# Patient Record
Sex: Female | Born: 1995
Health system: Southern US, Community
[De-identification: ages and names within clinical notes are randomized; demographics above are authoritative.]

## PROBLEM LIST (undated history)

## (undated) DIAGNOSIS — L309 Dermatitis, unspecified: Secondary | ICD-10-CM

## (undated) DIAGNOSIS — M21611 Bunion of right foot: Secondary | ICD-10-CM

## (undated) DIAGNOSIS — M21612 Bunion of left foot: Secondary | ICD-10-CM

## (undated) HISTORY — PX: WISDOM TOOTH EXTRACTION: SHX21

## (undated) HISTORY — PX: FOOT SURGERY: SHX648

---
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
## 2016-06-13 ENCOUNTER — Encounter (HOSPITAL_COMMUNITY): Payer: Self-pay | Admitting: Emergency Medicine

## 2016-06-13 ENCOUNTER — Emergency Department (HOSPITAL_COMMUNITY)
Admission: EM | Admit: 2016-06-13 | Discharge: 2016-06-13 | Disposition: A | Discharge status: Home / Self Care | Payer: Self-pay | Attending: Dermatology | Admitting: Dermatology

## 2016-06-13 DIAGNOSIS — K920 Hematemesis: Secondary | ICD-10-CM | POA: Insufficient documentation

## 2016-06-13 DIAGNOSIS — Z5321 Procedure and treatment not carried out due to patient leaving prior to being seen by health care provider: Secondary | ICD-10-CM | POA: Insufficient documentation

## 2016-06-13 DIAGNOSIS — R51 Headache: Secondary | ICD-10-CM | POA: Insufficient documentation

## 2016-06-13 LAB — COMPREHENSIVE METABOLIC PANEL
ALK PHOS: 85 U/L (ref 38–126)
ALT: 15 U/L (ref 14–54)
AST: 24 U/L (ref 15–41)
Albumin: 5 g/dL (ref 3.5–5.0)
Anion gap: 9 (ref 5–15)
BILIRUBIN TOTAL: 0.6 mg/dL (ref 0.3–1.2)
BUN: 12 mg/dL (ref 6–20)
CALCIUM: 10.1 mg/dL (ref 8.9–10.3)
CO2: 22 mmol/L (ref 22–32)
CREATININE: 0.96 mg/dL (ref 0.44–1.00)
Chloride: 108 mmol/L (ref 101–111)
Glucose, Bld: 95 mg/dL (ref 65–99)
Potassium: 4.1 mmol/L (ref 3.5–5.1)
SODIUM: 139 mmol/L (ref 135–145)
Total Protein: 7.7 g/dL (ref 6.5–8.1)

## 2016-06-13 LAB — CBC WITH DIFFERENTIAL/PLATELET
BASOS PCT: 0 %
Basophils Absolute: 0 10*3/uL (ref 0.0–0.1)
EOS ABS: 0 10*3/uL (ref 0.0–0.7)
Eosinophils Relative: 0 %
HCT: 42.9 % (ref 36.0–46.0)
HEMOGLOBIN: 14.1 g/dL (ref 12.0–15.0)
Lymphocytes Relative: 26 %
Lymphs Abs: 2 10*3/uL (ref 0.7–4.0)
MCH: 30.1 pg (ref 26.0–34.0)
MCHC: 32.9 g/dL (ref 30.0–36.0)
MCV: 91.7 fL (ref 78.0–100.0)
MONO ABS: 0.5 10*3/uL (ref 0.1–1.0)
MONOS PCT: 6 %
NEUTROS PCT: 68 %
Neutro Abs: 5.1 10*3/uL (ref 1.7–7.7)
Platelets: 269 10*3/uL (ref 150–400)
RBC: 4.68 MIL/uL (ref 3.87–5.11)
RDW: 12.3 % (ref 11.5–15.5)
WBC: 7.7 10*3/uL (ref 4.0–10.5)

## 2016-06-13 LAB — LIPASE, BLOOD: Lipase: 15 U/L (ref 11–51)

## 2016-06-13 LAB — I-STAT BETA HCG BLOOD, ED (MC, WL, AP ONLY)

## 2016-06-13 MED ORDER — OXYCODONE-ACETAMINOPHEN 5-325 MG PO TABS
1.0000 | ORAL_TABLET | ORAL | Status: DC | PRN
  Administered 2016-06-13: 1 via ORAL

## 2016-06-13 MED ORDER — OXYCODONE-ACETAMINOPHEN 5-325 MG PO TABS
ORAL_TABLET | ORAL | Status: AC
  Filled 2016-06-13: qty 1

## 2016-06-13 MED ORDER — ONDANSETRON 4 MG PO TBDP
ORAL_TABLET | ORAL | Status: AC
  Filled 2016-06-13: qty 1

## 2016-06-13 MED ORDER — ONDANSETRON 4 MG PO TBDP
4.0000 mg | ORAL_TABLET | Freq: Once | ORAL | Status: AC
  Administered 2016-06-13: 4 mg via ORAL

## 2016-06-13 NOTE — ED Notes (Signed)
Patient states does not want to wait anymore will come back if needed.

## 2016-06-13 NOTE — ED Triage Notes (Signed)
Pt reports new onset headache today with pressure around eyes. Pt reports emesis today X3 with blood noted in emesis. LMP- two months ago, pt receives Deppo. Pt also reports a lump to bottom of throat. No acute distress noted. Breathing freely without difficulty.

## 2016-06-14 DIAGNOSIS — R51 Headache: Secondary | ICD-10-CM | POA: Insufficient documentation

## 2016-06-14 DIAGNOSIS — K920 Hematemesis: Secondary | ICD-10-CM | POA: Diagnosis not present

## 2016-06-15 ENCOUNTER — Emergency Department (HOSPITAL_COMMUNITY)
Admission: EM | Admit: 2016-06-15 | Discharge: 2016-06-15 | Disposition: A | Discharge status: Home / Self Care | Payer: BLUE CROSS/BLUE SHIELD | Attending: Emergency Medicine | Admitting: Emergency Medicine

## 2016-06-15 ENCOUNTER — Emergency Department (HOSPITAL_COMMUNITY): Payer: BLUE CROSS/BLUE SHIELD

## 2016-06-15 ENCOUNTER — Encounter (HOSPITAL_COMMUNITY): Payer: Self-pay | Admitting: Emergency Medicine

## 2016-06-15 DIAGNOSIS — K920 Hematemesis: Secondary | ICD-10-CM

## 2016-06-15 DIAGNOSIS — R519 Headache, unspecified: Secondary | ICD-10-CM

## 2016-06-15 DIAGNOSIS — R51 Headache: Secondary | ICD-10-CM

## 2016-06-15 LAB — CBC WITH DIFFERENTIAL/PLATELET
BASOS ABS: 0 10*3/uL (ref 0.0–0.1)
Basophils Relative: 1 %
EOS PCT: 2 %
Eosinophils Absolute: 0.1 10*3/uL (ref 0.0–0.7)
HCT: 41 % (ref 36.0–46.0)
Hemoglobin: 13.3 g/dL (ref 12.0–15.0)
LYMPHS PCT: 48 %
Lymphs Abs: 2.6 10*3/uL (ref 0.7–4.0)
MCH: 29.8 pg (ref 26.0–34.0)
MCHC: 32.4 g/dL (ref 30.0–36.0)
MCV: 91.7 fL (ref 78.0–100.0)
MONO ABS: 0.5 10*3/uL (ref 0.1–1.0)
MONOS PCT: 10 %
Neutro Abs: 2.1 10*3/uL (ref 1.7–7.7)
Neutrophils Relative %: 39 %
PLATELETS: 237 10*3/uL (ref 150–400)
RBC: 4.47 MIL/uL (ref 3.87–5.11)
RDW: 12.3 % (ref 11.5–15.5)
WBC: 5.3 10*3/uL (ref 4.0–10.5)

## 2016-06-15 LAB — BASIC METABOLIC PANEL
Anion gap: 6 (ref 5–15)
BUN: 11 mg/dL (ref 6–20)
CALCIUM: 9.8 mg/dL (ref 8.9–10.3)
CO2: 24 mmol/L (ref 22–32)
Chloride: 106 mmol/L (ref 101–111)
Creatinine, Ser: 0.93 mg/dL (ref 0.44–1.00)
GFR calc Af Amer: >60 mL/min (ref >60)
GLUCOSE: 101 mg/dL — AB (ref 65–99)
Potassium: 3.9 mmol/L (ref 3.5–5.1)
Sodium: 136 mmol/L (ref 135–145)

## 2016-06-15 LAB — HCG, SERUM, QUALITATIVE: Preg, Serum: NEGATIVE

## 2016-06-15 MED ORDER — ONDANSETRON HCL 4 MG/2ML IJ SOLN
4.0000 mg | Freq: Once | INTRAMUSCULAR | Status: AC
  Administered 2016-06-15: 4 mg via INTRAVENOUS
  Filled 2016-06-15: qty 2

## 2016-06-15 MED ORDER — KETOROLAC TROMETHAMINE 30 MG/ML IJ SOLN
30.0000 mg | Freq: Once | INTRAMUSCULAR | Status: AC
  Administered 2016-06-15: 30 mg via INTRAVENOUS
  Filled 2016-06-15: qty 1

## 2016-06-15 MED ORDER — KETOROLAC TROMETHAMINE 60 MG/2ML IM SOLN: 60.0000 mg | Freq: Once | INTRAMUSCULAR | Status: DC

## 2016-06-15 MED ORDER — SODIUM CHLORIDE 0.9 % IV BOLUS (SEPSIS)
1000.0000 mL | Freq: Once | INTRAVENOUS | Status: AC
  Administered 2016-06-15: 1000 mL via INTRAVENOUS

## 2016-06-15 NOTE — ED Triage Notes (Signed)
Pt. reports headache onset yesterday and skin lump at lower throat , denies head injury , no nausea or emesis.

## 2016-06-15 NOTE — ED Provider Notes (Signed)
[facility]-EMERGENCY DEPT Provider Note   CSN: [id number] Arrival date & time: 06/14/16  2359  First Provider Contact:  None       History   Chief Complaint Chief Complaint  Patient presents with  . Migraine  . Mass    HPI Lisa Lopez is a 20 y.o. female.  Patient is an 20 year old female with no significant past medical history. She presents for evaluation of headache that has been ongoing for the past 2 days. Her headache is frontal and radiates to the back of her head. She reports associated nausea and vomiting of what she describes as bloody vomit. She denies any fevers or chills. She denies any visual disturbances. She also complains of discomfort in the bottom of her throat.   The history is provided by the patient.  Migraine  This is a new problem. The current episode started 2 days ago. The problem occurs constantly. The problem has been gradually worsening. Pertinent negatives include no shortness of breath. Nothing aggravates the symptoms. Nothing relieves the symptoms. She has tried nothing for the symptoms. The treatment provided no relief.    Past Medical History:  Diagnosis Date  . Bunion of great toe of left foot   . Bunion of great toe of right foot   . Eczema     There are no active problems to display for this patient.   Past Surgical History:  Procedure Laterality Date  . FOOT SURGERY    . WISDOM TOOTH EXTRACTION      OB History    No data available       Home Medications    Prior to Admission medications   Medication Sig Start Date End Date Taking? Authorizing Provider  cephALEXin (KEFLEX) 500 MG capsule Take 1 capsule (500 mg total) by mouth 3 (three) times daily. Patient not taking: Reported on 06/15/2016 07/10/14   Marcellina Millin, MD    Family History No family history on file.  Social History Social History  Substance Use Topics  . Smoking status: Never Smoker  . Smokeless tobacco: Never Used  . Alcohol use No     Allergies     Review of patient's allergies indicates no known allergies.   Review of Systems Review of Systems  Respiratory: Negative for shortness of breath.   All other systems reviewed and are negative.    Physical Exam Updated Vital Signs BP 125/63 (BP Location: Right Arm)   Pulse 76   Temp 99.3 F (37.4 C) (Oral)   Resp 16   Ht 5\' 3"  (1.6 m)   Wt 136 lb (61.7 kg)   LMP 04/13/2016   SpO2 100%   BMI 24.09 kg/m   Physical Exam  Constitutional: She is oriented to person, place, and time. She appears well-developed and well-nourished. No distress.  HENT:  Head: Normocephalic and atraumatic.  Mouth/Throat: Oropharynx is clear and moist. No oropharyngeal exudate.  Eyes: EOM are normal. Pupils are equal, round, and reactive to light.  Neck: Normal range of motion. Neck supple.  Cardiovascular: Normal rate and regular rhythm.  Exam reveals no gallop and no friction rub.   No murmur heard. Pulmonary/Chest: Effort normal and breath sounds normal. No respiratory distress. She has no wheezes.  Abdominal: Soft. Bowel sounds are normal. She exhibits no distension. There is no tenderness.  Musculoskeletal: Normal range of motion. She exhibits no edema.  Neurological: She is alert and oriented to person, place, and time.  Skin: Skin is warm and dry. She is  not diaphoretic.  Nursing note and vitals reviewed.    ED Treatments / Results  Labs (all labs ordered are listed, but only abnormal results are displayed) Labs Reviewed  BASIC METABOLIC PANEL  CBC WITH DIFFERENTIAL/PLATELET  HCG, SERUM, QUALITATIVE    EKG  EKG Interpretation None       Radiology No results found.  Procedures Procedures (including critical care time)  Medications Ordered in ED Medications  sodium chloride 0.9 % bolus 1,000 mL (not administered)  ketorolac (TORADOL) 30 MG/ML injection 30 mg (not administered)  ondansetron (ZOFRAN) injection 4 mg (not administered)     Initial Impression / Assessment  and Plan / ED Course  I have reviewed the triage vital signs and the nursing notes.  Pertinent labs & imaging results that were available during my care of the patient were reviewed by me and considered in my medical decision making (see chart for details).  Clinical Course      Final Clinical Impressions(s) / ED Diagnoses   Final diagnoses:  None   Patient presents here with multiple complaints that do not appear to be related. She is reporting headache, discomfort in her throat, and vomiting blood. Her laboratory studies are unremarkable, neurologic and abdominal exams are nonfocal, and she appears clinically well. Head CT reveals no evidence for intracranial pathology. At this point, I'm having a difficult time attributing all of these symptoms to 1 disease process. I see no indication for further testing. We will give this time and see how things unfold. If she worsens, she is to return to be reevaluated.  New Prescriptions New Prescriptions   No medications on file     Geoffery Lyons, MD 06/15/16 [phone]

## 2016-06-15 NOTE — Discharge Instructions (Signed)
Ibuprofen 600 mg every 6 hours as needed for pain. ° °Return to the emergency department if your symptoms significantly worsen or change. °

## 2016-07-26 ENCOUNTER — Encounter: Payer: Self-pay | Admitting: Neurology

## 2016-07-26 ENCOUNTER — Ambulatory Visit (INDEPENDENT_AMBULATORY_CARE_PROVIDER_SITE_OTHER): Payer: BLUE CROSS/BLUE SHIELD | Admitting: Neurology

## 2016-07-26 VITALS — BP 112/68 | HR 86 | Ht 63.0 in | Wt 136.6 lb

## 2016-07-26 DIAGNOSIS — G43709 Chronic migraine without aura, not intractable, without status migrainosus: Secondary | ICD-10-CM

## 2016-07-26 MED ORDER — ONDANSETRON HCL 4 MG PO TABS: 4.0000 mg | ORAL_TABLET | Freq: Three times a day (TID) | ORAL | 0 refills | Status: AC | PRN

## 2016-07-26 MED ORDER — SUMATRIPTAN SUCCINATE 100 MG PO TABS: ORAL_TABLET | ORAL | 2 refills | Status: AC

## 2016-07-26 MED ORDER — PROPRANOLOL HCL ER 60 MG PO CP24: 60.0000 mg | ORAL_CAPSULE | Freq: Every day | ORAL | 0 refills | Status: DC

## 2016-07-26 NOTE — Progress Notes (Signed)
NEUROLOGY CONSULTATION NOTE  Lisa Lopez MRN: [id number] DOB: [birth year]  Referring provider: Dr. Chales Salmon Primary care provider: Dr. Chales Salmon  Reason for consult:  headache  HISTORY OF PRESENT ILLNESS: Lisa Lopez is an 20 year old right-handed female with depression and anxiety who presents for headache.  History obtained by patient, PCP note and ED note.  Onset:  Age 52 Location:  Bi-frontal/temporal.  Notes some bilateral myofascial neck pain. Quality:  throbbing Intensity:  From 5-6 to 8-9/10 Aura:  no Prodrome:  no Associated symptoms:  Nausea, vomiting, phonophobia.  No photophobia or visual disturbance Duration:  1 to 5 hours Frequency:  Daily (severe 2 to 3 days per week) Triggers/exacerbating factors:  stress Relieving factors:  no Activity:  Cannot function when severe  CT of head from 06/15/16 was personally reviewed and was normal Labs from 06/15/16 include CBC with WBC 5.3, HGB 13.3, HCT 41, and PLT 237.  BMP showed Na 136, K 3.9, Cl 106, CO2 24, glucose 101, BUN 11 and Cr 0.93.  Past NSAIDS:  ibuprofen Past analgesics:  Tylenol, Goodys Past abortive triptans:  no Past muscle relaxants:  no Past anti-emetic:  no Past antihypertensive medications:  no Past antidepressant medications:  bupropion Past anticonvulsant medications:  no Past vitamins/Herbal/Supplements:  no Past antihistamines/decongestants:  no  Current NSAIDS:  Aleve Current analgesics:  no Current triptans:  no Current anti-emetic:  no Current muscle relaxants:  no Current anti-anxiolytic:  no Current sleep aide:  no Current Antihypertensive medications:  no Current Antidepressant medications:  Paxil Current Anticonvulsant medications:  no Current Vitamins/Herbal/Supplements:  no Current Antihistamines/Decongestants:  no Other therapy:  no Other medication:  Depo  Caffeine:  Sweet tea Alcohol:  no Smoker:  no Diet:  Does not hydrate enough.  No fast food. Exercise:  Not  routine Depression/stress:  controlled Sleep hygiene:  good Family history of headache:  No.  No family history of cerebral aneurysm  PAST MEDICAL HISTORY: Past Medical History:  Diagnosis Date  . Bunion of great toe of left foot   . Bunion of great toe of right foot   . Eczema     PAST SURGICAL HISTORY: Past Surgical History:  Procedure Laterality Date  . FOOT SURGERY    . WISDOM TOOTH EXTRACTION      MEDICATIONS: Paxil  ALLERGIES: No Known Allergies  FAMILY HISTORY: No family history of headache  SOCIAL HISTORY: Social History   Social History  . Marital status: Single    Spouse name: N/A  . Number of children: N/A  . Years of education: N/A   Occupational History  . Not on file.   Social History Main Topics  . Smoking status: Never Smoker  . Smokeless tobacco: Never Used  . Alcohol use No  . Drug use: No  . Sexual activity: Not on file   Other Topics Concern  . Not on file   Social History Narrative  . No narrative on file    REVIEW OF SYSTEMS: Constitutional: No fevers, chills, or sweats, no generalized fatigue, change in appetite Eyes: No visual changes, double vision, eye pain Ear, nose and throat: No hearing loss, ear pain, nasal congestion, sore throat Cardiovascular: No chest pain, palpitations Respiratory:  No shortness of breath at rest or with exertion, wheezes GastrointestinaI: No nausea, vomiting, diarrhea, abdominal pain, fecal incontinence Genitourinary:  No dysuria, urinary retention or frequency Musculoskeletal:  No neck pain, back pain Integumentary: No rash, pruritus, skin lesions Neurological: as above Psychiatric: No depression, insomnia,  anxiety Endocrine: No palpitations, fatigue, diaphoresis, mood swings, change in appetite, change in weight, increased thirst Hematologic/Lymphatic:  No purpura, petechiae. Allergic/Immunologic: no itchy/runny eyes, nasal congestion, recent allergic reactions, rashes  PHYSICAL  EXAM: Vitals:   07/26/16 0958  BP: 112/68  Pulse: 86   General: No acute distress.  Patient appears well-groomed.  Head:  Normocephalic/atraumatic Eyes:  fundi examined but not visualized Neck: supple, no paraspinal tenderness, full range of motion Back: No paraspinal tenderness Heart: regular rate and rhythm Lungs: Clear to auscultation bilaterally. Vascular: No carotid bruits. Neurological Exam: Mental status: alert and oriented to person, place, and time, recent and remote memory intact, fund of knowledge intact, attention and concentration intact, speech fluent and not dysarthric, language intact. Cranial nerves: CN I: not tested CN II: pupils equal, round and reactive to light, visual fields intact CN III, IV, VI:  full range of motion, no nystagmus, no ptosis CN V: facial sensation intact CN VII: upper and lower face symmetric CN VIII: hearing intact CN IX, X: gag intact, uvula midline CN XI: sternocleidomastoid and trapezius muscles intact CN XII: tongue midline Bulk & Tone: normal, no fasciculations. Motor:  5/5 throughout  Sensation: temperature and vibration sensation intact. Deep Tendon Reflexes:  2+ throughout, toes downgoing.  Finger to nose testing:  Without dysmetria.  Heel to shin:  Without dysmetria.  Gait:  Normal station and stride.  Able to turn and tandem walk. Romberg negative.  IMPRESSION: Chronic migraine without aura  PLAN: 1.  Start propranolol ER 60mg  daily.  Cautioned for lightheadedness, low blood pressure and heart rate. 2.  Sumatriptan 100mg  for abortive therapy.  Zofran 4mg  for nausea 3.  Lifestyle modification:  Hydration, exercise, consider supplements (Mg, riboflavin, coenzyme Q10) 4.  Headache diary 5.  Contact us in 4 weeks with update.  Follow up in 3 months.  Thank you for allowing me to take part in the care of this patient.  Shon MilletAdam Nely Dedmon, DO  CC:  Lorenda Ishiharaupashree Varadarajan, MD

## 2016-07-26 NOTE — Progress Notes (Signed)
Chart forwarded.  

## 2016-07-26 NOTE — Patient Instructions (Signed)
Migraine Recommendations: 1.  Start propranolol ER 60mg  daily.  Call in 4 weeks with update and we can adjust dose if needed.  Caution for lightheadedness. 2.  Take sumatriptan 100mg  at earliest onset of headache.  May repeat dose once in 2 hours if needed.  Do not exceed two tablets in 24 hours.  May take Zofran as directed for nausea. 3.  Limit use of pain relievers to no more than 2 days out of the week.  These medications include acetaminophen, ibuprofen, triptans and narcotics.  This will help reduce risk of rebound headaches. 4.  Be aware of common food triggers such as processed sweets, processed foods with nitrites (such as deli meat, hot dogs, sausages), foods with MSG, alcohol (such as wine), chocolate, certain cheeses, certain fruits (dried fruits, some citrus fruit), vinegar, diet soda. 4.  Avoid caffeine 5.  Routine exercise 6.  Proper sleep hygiene 7.  Stay adequately hydrated with water 8.  Keep a headache diary. 9.  Maintain proper stress management. 10.  Do not skip meals. 11.  Consider supplements:  Magnesium oxide 400mg  to 600mg  daily, riboflavin 400mg , Coenzyme Q 10 100mg  three times daily 12.  Follow up in 3 months but contact us in 4 weeks with update.

## 2016-10-26 ENCOUNTER — Ambulatory Visit: Payer: BLUE CROSS/BLUE SHIELD | Admitting: Neurology

## 2016-10-27 ENCOUNTER — Encounter: Payer: Self-pay | Admitting: Neurology

## 2016-12-12 DIAGNOSIS — Z3042 Encounter for surveillance of injectable contraceptive: Secondary | ICD-10-CM | POA: Diagnosis not present

## 2017-03-06 DIAGNOSIS — Z3042 Encounter for surveillance of injectable contraceptive: Secondary | ICD-10-CM | POA: Diagnosis not present

## 2017-04-17 ENCOUNTER — Encounter (HOSPITAL_COMMUNITY): Payer: Self-pay | Admitting: Emergency Medicine

## 2017-04-17 ENCOUNTER — Emergency Department (HOSPITAL_COMMUNITY)
Admission: EM | Admit: 2017-04-17 | Discharge: 2017-04-17 | Disposition: A | Discharge status: Home / Self Care | Payer: BLUE CROSS/BLUE SHIELD | Attending: Emergency Medicine | Admitting: Emergency Medicine

## 2017-04-17 ENCOUNTER — Emergency Department (HOSPITAL_COMMUNITY): Payer: BLUE CROSS/BLUE SHIELD

## 2017-04-17 DIAGNOSIS — R072 Precordial pain: Secondary | ICD-10-CM | POA: Diagnosis not present

## 2017-04-17 DIAGNOSIS — R079 Chest pain, unspecified: Secondary | ICD-10-CM | POA: Diagnosis not present

## 2017-04-17 LAB — CBC WITH DIFFERENTIAL/PLATELET
Basophils Absolute: 0 10*3/uL (ref 0.0–0.1)
Basophils Relative: 0 %
Eosinophils Absolute: 0.1 10*3/uL (ref 0.0–0.7)
Eosinophils Relative: 2 %
HCT: 41.4 % (ref 36.0–46.0)
Hemoglobin: 13.5 g/dL (ref 12.0–15.0)
Lymphocytes Relative: 30 %
Lymphs Abs: 1.6 10*3/uL (ref 0.7–4.0)
MCH: 29.7 pg (ref 26.0–34.0)
MCHC: 32.6 g/dL (ref 30.0–36.0)
MCV: 91 fL (ref 78.0–100.0)
Monocytes Absolute: 0.5 10*3/uL (ref 0.1–1.0)
Monocytes Relative: 10 %
Neutro Abs: 3 10*3/uL (ref 1.7–7.7)
Neutrophils Relative %: 58 %
Platelets: 254 10*3/uL (ref 150–400)
RBC: 4.55 MIL/uL (ref 3.87–5.11)
RDW: 12.4 % (ref 11.5–15.5)
WBC: 5.2 10*3/uL (ref 4.0–10.5)

## 2017-04-17 LAB — BASIC METABOLIC PANEL
Anion gap: 7 (ref 5–15)
BUN: 13 mg/dL (ref 6–20)
CO2: 26 mmol/L (ref 22–32)
Calcium: 9.7 mg/dL (ref 8.9–10.3)
Chloride: 107 mmol/L (ref 101–111)
Creatinine, Ser: 1.17 mg/dL — ABNORMAL HIGH (ref 0.44–1.00)
GFR calc Af Amer: >60 mL/min (ref >60)
GFR calc non Af Amer: >60 mL/min (ref >60)
Glucose, Bld: 107 mg/dL — ABNORMAL HIGH (ref 65–99)
Potassium: 3.9 mmol/L (ref 3.5–5.1)
Sodium: 140 mmol/L (ref 135–145)

## 2017-04-17 LAB — TROPONIN I: Troponin I: <0.03 ng/mL (ref <0.03)

## 2017-04-17 NOTE — ED Notes (Signed)
Pt stable, ambulatory, states understanding of discharge instructions 

## 2017-04-17 NOTE — ED Provider Notes (Signed)
[facility]EMER[facility]ENCY DEPT Provider Note   CSN: [id number] Arrival date & time: 04/17/17  1359  By signing my name below, I, Phillips ClimesFabiola de Louis, attest that this documentation has been prepared under the direction and in the presence of Raeford RazorKohut, Shatyra Becka, MD . Electronically Signed: Phillips ClimesFabiola de Louis, Scribe. 04/17/2017. 5:24 PM.  History   Chief Complaint Chief Complaint  Patient presents with  . Chest Pain   HPI Comments Lisa Lopez is a 21 y.o. female with no reported PMHx, who presents to the Emergency Department with complaints of a sharp, waxing and waning chest pain x1 week.  Pt describes this as a shooting and throbbing substernal pain that is worse with coughing and currently rated a 10/10 in severity. Non-exertional; not worse with walking or exercise.  No change with eating.   Associated with dyspnea, numbness and nausea. Episodes have become progressively worse, this one lasting x2 hours.  No leg pain or swelling.  No hx of PE/DVT, recent long travel, surgery, fracture, prolonged immobilization.  Pt is on depo for birth control.  She has attempted to treat her sx with alka-seltzer and Excedrin, and experienced no relief.  Pt denies experiencing any other acute sx, including vomiting, fevers or chills.  The history is provided by the patient and medical records. No language interpreter was used.   Past Medical History:  Diagnosis Date  . Bunion of great toe of left foot   . Bunion of great toe of right foot   . Eczema     Patient Active Problem List   Diagnosis Date Noted  . Chronic migraine without aura without status migrainosus, not intractable 07/26/2016    Past Surgical History:  Procedure Laterality Date  . FOOT SURGERY    . WISDOM TOOTH EXTRACTION      OB History    No data available       Home Medications    Prior to Admission medications   Medication Sig Start Date End Date Taking? Authorizing Provider  medroxyPROGESTERone (DEPO-PROVERA) 150 MG/ML injection  INJECT 1 ML BY INTRAMUSCULAR ROUTE ONCE EVERY 13 WEEKS 04/27/16   [provider]  ondansetron (ZOFRAN) 4 MG tablet Take 1 tablet (4 mg total) by mouth every 8 (eight) hours as needed for nausea or vomiting. 07/26/16   Everlena CooperJaffe, Adam R, DO  PARoxetine (PAXIL) 10 MG tablet Take 10 mg by mouth every morning. 07/08/16   [provider]  propranolol ER (INDERAL LA) 60 MG 24 hr capsule Take 1 capsule (60 mg total) by mouth daily. 07/26/16   Drema DallasJaffe, Adam R, DO  SUMAtriptan (IMITREX) 100 MG tablet Take 1 tab at earliest onset of headache.  May repeat once in 2 hours if headache persists or recurs. Do not exceed 2 tabs in 24 hrs. 07/26/16   Drema DallasJaffe, Adam R, DO    Family History No family history on file.  Social History Social History  Substance Use Topics  . Smoking status: Never Smoker  . Smokeless tobacco: Never Used  . Alcohol use No     Allergies   Patient has no known allergies.   Review of Systems Review of Systems  Constitutional: Negative for chills and fever.  Respiratory: Positive for cough and shortness of breath.   Cardiovascular: Positive for chest pain. Negative for leg swelling.  Gastrointestinal: Positive for nausea. Negative for vomiting.  Musculoskeletal: Negative for myalgias.  Neurological: Positive for numbness.  All other systems reviewed and are negative.  Physical Exam Updated Vital Signs BP 117/60  Pulse 81   Temp 98.4 F (36.9 C) (Oral)   Resp 17   SpO2 100%   Physical Exam  Constitutional: She is oriented to person, place, and time. She appears well-developed and well-nourished. No distress.  HENT:  Head: Normocephalic and atraumatic.  Eyes: EOM are normal.  Neck: Normal range of motion.  Cardiovascular: Normal rate, regular rhythm and normal heart sounds.   Pulmonary/Chest: Effort normal and breath sounds normal.  Abdominal: Soft. She exhibits no distension. There is no tenderness.  Musculoskeletal: Normal range of motion.    Neurological: She is alert and oriented to person, place, and time.  Skin: Skin is warm and dry.  Psychiatric: She has a normal mood and affect. Judgment normal.  Nursing note and vitals reviewed.  ED Treatments / Results  DIAGNOSTIC STUDIES: Oxygen Saturation is 100% on room air, normal by my interpretation.    COORDINATION OF CARE: 3:17 PM Discussed treatment plan with pt at bedside and pt agreed to plan.  Labs (all labs ordered are listed, but only abnormal results are displayed) Labs Reviewed  BASIC METABOLIC PANEL - Abnormal; Notable for the following:       Result Value   Glucose, Bld 107 (*)    Creatinine, Ser 1.17 (*)    All other components within normal limits  CBC WITH DIFFERENTIAL/PLATELET  TROPONIN I    EKG  EKG Interpretation  Date/Time:  Monday April 17 2017 15:13:47 EDT Ventricular Rate:  77 PR Interval:    QRS Duration: 80 QT Interval:  343 QTC Calculation: 389 R Axis:   41 Text Interpretation:  Sinus rhythm Borderline short PR interval Borderline Q waves in inferior leads Baseline wander in lead(s) I II III aVL aVF V3 Confirmed by Juleen China  MD, Abhiraj Dozal [id number]) on 04/17/2017 4:11:59 PM       Radiology Dg Chest 2 View  Result Date: 04/17/2017 CLINICAL DATA:  Chest pain EXAM: CHEST  2 VIEW COMPARISON:  None. FINDINGS: The heart size and mediastinal contours are within normal limits. Both lungs are clear. The visualized skeletal structures are unremarkable. IMPRESSION: No active cardiopulmonary disease. Electronically Signed   By: Jasmine Pang M.D.   On: 04/17/2017 15:52    Procedures Procedures (including critical care time)  Medications Ordered in ED Medications - No data to display   Initial Impression / Assessment and Plan / ED Course  I have reviewed the triage vital signs and the nursing notes.  Pertinent labs & imaging results that were available during my care of the patient were reviewed by me and considered in my medical decision making (see  chart for details).    20yF with CP. Atypical for ACS. Doubt PE, dissection or other emergent process. It has been determined that no acute conditions requiring further emergency intervention are present at this time. The patient has been advised of the diagnosis and plan. I reviewed any labs and imaging including any potential incidental findings. We have discussed signs and symptoms that warrant return to the ED and they are listed in the discharge instructions.   I personally preformed the services scribed in my presence. The recorded information has been reviewed is accurate. Raeford Razor, MD.   Final Clinical Impressions(s) / ED Diagnoses   Final diagnoses:  Chest pain, unspecified type    New Prescriptions New Prescriptions   No medications on file     Raeford Razor, MD 04/24/17 1150

## 2017-04-17 NOTE — ED Triage Notes (Signed)
Pt reports sharp chest pain that is in her mid chest and sometimes her right chest. Pain is off and on, but states it is better when she is not thinking about it. Pt denies any long trips or calf pain. Tearful in triage, pt states she feels like she may have some anxiety but has never been diagnosed. Pt a/ox4, resp e/u, nad.

## 2017-04-27 DIAGNOSIS — M940 Chondrocostal junction syndrome [Tietze]: Secondary | ICD-10-CM | POA: Diagnosis not present

## 2017-05-03 DIAGNOSIS — R202 Paresthesia of skin: Secondary | ICD-10-CM | POA: Diagnosis not present

## 2017-05-08 DIAGNOSIS — K219 Gastro-esophageal reflux disease without esophagitis: Secondary | ICD-10-CM | POA: Diagnosis not present

## 2017-05-08 DIAGNOSIS — R079 Chest pain, unspecified: Secondary | ICD-10-CM | POA: Diagnosis not present

## 2017-05-17 ENCOUNTER — Emergency Department (HOSPITAL_COMMUNITY)
Admission: EM | Admit: 2017-05-17 | Discharge: 2017-05-17 | Disposition: A | Discharge status: Home / Self Care | Payer: BLUE CROSS/BLUE SHIELD | Attending: Emergency Medicine | Admitting: Emergency Medicine

## 2017-05-17 ENCOUNTER — Encounter (HOSPITAL_COMMUNITY): Payer: Self-pay | Admitting: Vascular Surgery

## 2017-05-17 DIAGNOSIS — R0789 Other chest pain: Secondary | ICD-10-CM | POA: Insufficient documentation

## 2017-05-17 DIAGNOSIS — G44309 Post-traumatic headache, unspecified, not intractable: Secondary | ICD-10-CM | POA: Diagnosis not present

## 2017-05-17 DIAGNOSIS — R51 Headache: Secondary | ICD-10-CM | POA: Diagnosis not present

## 2017-05-17 DIAGNOSIS — G44319 Acute post-traumatic headache, not intractable: Secondary | ICD-10-CM

## 2017-05-17 LAB — BASIC METABOLIC PANEL
ANION GAP: 7 (ref 5–15)
BUN: 12 mg/dL (ref 6–20)
CALCIUM: 9.4 mg/dL (ref 8.9–10.3)
CO2: 24 mmol/L (ref 22–32)
Chloride: 107 mmol/L (ref 101–111)
Creatinine, Ser: 1.02 mg/dL — ABNORMAL HIGH (ref 0.44–1.00)
Glucose, Bld: 101 mg/dL — ABNORMAL HIGH (ref 65–99)
POTASSIUM: 4.3 mmol/L (ref 3.5–5.1)
Sodium: 138 mmol/L (ref 135–145)

## 2017-05-17 LAB — CBC
HEMATOCRIT: 39.7 % (ref 36.0–46.0)
HEMOGLOBIN: 12.9 g/dL (ref 12.0–15.0)
MCH: 29.8 pg (ref 26.0–34.0)
MCHC: 32.5 g/dL (ref 30.0–36.0)
MCV: 91.7 fL (ref 78.0–100.0)
Platelets: 231 10*3/uL (ref 150–400)
RBC: 4.33 MIL/uL (ref 3.87–5.11)
RDW: 12.3 % (ref 11.5–15.5)
WBC: 6 10*3/uL (ref 4.0–10.5)

## 2017-05-17 LAB — I-STAT TROPONIN, ED: TROPONIN I, POC: 0 ng/mL (ref 0.00–0.08)

## 2017-05-17 NOTE — ED Triage Notes (Signed)
Pt reports to the ED for eval of CP x 3 weeks and HA x 1 week. Pt reports she has been seen for the CP before and she was seen here, [facility] x 2, and her PCP once. [facility] and her PCP took her off of her Naproxen and placed her on acid reducing medication. She also reports HA x 1 week but states the HA is getting worse. Reports some phonophobia and photophobia. Tried Excedrin migraine without relief in her HA. Pt reports nausea but denies any active vomiting.

## 2017-05-17 NOTE — ED Provider Notes (Signed)
[facility] Provider Note   CSN: [id number] Arrival date & time: 05/17/17  2144     History   Chief Complaint Chief Complaint  Patient presents with  . Chest Pain  . Headache    HPI Lisa Lopez is a 21 y.o. female.  Patient with no significant contributing medical history presents with complaint of ongoing chest pain that is located across the chest and started 3 weeks ago. She has been evaluated by ED, Urgent Care (x2) and PCP without resolution. No SOB, cough, fever, nausea or vomiting. No alleviating or aggravating factors. She has taken naproxen and reports the pain became worse and was then started on Nexium, also without relief.   She is also complaining of a headache. One week ago she bent over to pick something off the floor and hit her head on the counter when standing back up. No LOC at the time. No nausea, vomiting, visual changes. She hit the back of her and reports that now the headache has become generalized. No modifying factors. She has taken Exedrin without relief.    The history is provided by the patient. No language interpreter was used.  Chest Pain   Associated symptoms include headaches. Pertinent negatives include no fever.  Headache   Pertinent negatives include no fever.    Past Medical History:  Diagnosis Date  . Bunion of great toe of left foot   . Bunion of great toe of right foot   . Eczema     Patient Active Problem List   Diagnosis Date Noted  . Chronic migraine without aura without status migrainosus, not intractable 07/26/2016    Past Surgical History:  Procedure Laterality Date  . FOOT SURGERY    . WISDOM TOOTH EXTRACTION      OB History    No data available       Home Medications    Prior to Admission medications   Medication Sig Start Date End Date Taking? Authorizing Provider  medroxyPROGESTERone (DEPO-PROVERA) 150 MG/ML injection INJECT 1 ML BY INTRAMUSCULAR ROUTE ONCE EVERY 13 WEEKS 04/27/16  Yes [provider]  ondansetron (ZOFRAN) 4 MG tablet Take 1 tablet (4 mg total) by mouth every 8 (eight) hours as needed for nausea or vomiting. 07/26/16  Yes Everlena Cooper, Adam R, DO  SUMAtriptan (IMITREX) 100 MG tablet Take 1 tab at earliest onset of headache.  May repeat once in 2 hours if headache persists or recurs. Do not exceed 2 tabs in 24 hrs. 07/26/16  Yes Drema Dallas, DO    Family History No family history on file.  Social History Social History  Substance Use Topics  . Smoking status: Never Smoker  . Smokeless tobacco: Never Used  . Alcohol use No     Allergies   Patient has no known allergies.   Review of Systems Review of Systems  Constitutional: Negative for chills and fever.  HENT: Negative.   Eyes: Negative for visual disturbance.  Respiratory: Negative.   Cardiovascular: Positive for chest pain.  Gastrointestinal: Negative.   Musculoskeletal: Negative.   Skin: Negative.   Neurological: Positive for headaches.     Physical Exam Updated Vital Signs BP 113/66   Pulse 71   Temp 98.8 F (37.1 C) (Oral)   Resp 18   Ht 5\' 3"  (1.6 m)   Wt 63.5 kg (140 lb)   SpO2 100%   BMI 24.80 kg/m   Physical Exam  Constitutional: She is oriented to person, place, and time. She  appears well-developed and well-nourished.  HENT:  Head: Normocephalic.  Eyes: Pupils are equal, round, and reactive to light.  Neck: Normal range of motion. Neck supple.  Cardiovascular: Normal rate and regular rhythm.   No murmur heard. Pulmonary/Chest: Effort normal and breath sounds normal. She has no wheezes. She has no rales. She exhibits tenderness.  Abdominal: Soft. Bowel sounds are normal. There is no tenderness. There is no rebound and no guarding.  Musculoskeletal: Normal range of motion.  Neurological: She is alert and oriented to person, place, and time.  CN's 3-12 grossly intact. Speech is clear and focused. No facial asymmetry. No lateralizing weakness. Reflexes are equal. No deficits  of coordination. Ambulatory without imbalance.    Skin: Skin is warm and dry. No rash noted.  Psychiatric: She has a normal mood and affect.     ED Treatments / Results  Labs (all labs ordered are listed, but only abnormal results are displayed) Labs Reviewed  BASIC METABOLIC PANEL - Abnormal; Notable for the following:       Result Value   Glucose, Bld 101 (*)    Creatinine, Ser 1.02 (*)    All other components within normal limits  CBC  I-STAT TROPOININ, ED    EKG  EKG Interpretation  Date/Time:  Wednesday May 17 2017 21:44:35 EDT Ventricular Rate:  81 PR Interval:  102 QRS Duration: 78 QT Interval:  340 QTC Calculation: 394 R Axis:   64 Text Interpretation:  Undetermined rhythm Nonspecific ST and T wave abnormality Abnormal ECG No significant change since last tracing Confirmed by Shaune PollackIsaacs, Cameron [phone]) on 05/17/2017 10:19:45 PM       Radiology No results found.  Procedures Procedures (including critical care time)  Medications Ordered in ED Medications - No data to display   Initial Impression / Assessment and Plan / ED Course  I have reviewed the triage vital signs and the nursing notes.  Pertinent labs & imaging results that were available during my care of the patient were reviewed by me and considered in my medical decision making (see chart for details).     Patient with chest pain x 3 weeks and headache x 2 weeks. Labs, including troponin, CXR and exam are essentially normal. Do not suspect PE, infection, ACS. Pain is reproducible supporting dx of chest wall pain.   Headache x 2 weeks. No neurologic deficit on exam. Likely secondary to minor head injury. Patient is reassured.     Final Clinical Impressions(s) / ED Diagnoses   Final diagnoses:  None   1. Chest wall pain 2. Headache  New Prescriptions New Prescriptions   No medications on file     Danne HarborUpstill, Florita Nitsch, PA-C 05/17/17 2329    Shaune PollackIsaacs, Cameron, MD 05/19/17 [phone]

## 2017-06-14 DIAGNOSIS — F410 Panic disorder [episodic paroxysmal anxiety] without agoraphobia: Secondary | ICD-10-CM | POA: Diagnosis not present

## 2017-06-14 DIAGNOSIS — F411 Generalized anxiety disorder: Secondary | ICD-10-CM | POA: Diagnosis not present

## 2017-06-22 DIAGNOSIS — F410 Panic disorder [episodic paroxysmal anxiety] without agoraphobia: Secondary | ICD-10-CM | POA: Diagnosis not present

## 2017-06-22 DIAGNOSIS — F411 Generalized anxiety disorder: Secondary | ICD-10-CM | POA: Diagnosis not present

## 2017-06-28 DIAGNOSIS — R000 Tachycardia, unspecified: Secondary | ICD-10-CM | POA: Diagnosis not present

## 2017-07-03 DIAGNOSIS — K219 Gastro-esophageal reflux disease without esophagitis: Secondary | ICD-10-CM | POA: Diagnosis not present

## 2017-07-03 DIAGNOSIS — F419 Anxiety disorder, unspecified: Secondary | ICD-10-CM | POA: Diagnosis not present

## 2017-07-03 DIAGNOSIS — R002 Palpitations: Secondary | ICD-10-CM | POA: Diagnosis not present

## 2017-07-06 DIAGNOSIS — F410 Panic disorder [episodic paroxysmal anxiety] without agoraphobia: Secondary | ICD-10-CM | POA: Diagnosis not present

## 2017-07-06 DIAGNOSIS — F411 Generalized anxiety disorder: Secondary | ICD-10-CM | POA: Diagnosis not present

## 2017-07-12 ENCOUNTER — Ambulatory Visit (INDEPENDENT_AMBULATORY_CARE_PROVIDER_SITE_OTHER): Payer: BLUE CROSS/BLUE SHIELD

## 2017-07-12 DIAGNOSIS — R002 Palpitations: Secondary | ICD-10-CM | POA: Diagnosis not present

## 2017-07-13 DIAGNOSIS — F410 Panic disorder [episodic paroxysmal anxiety] without agoraphobia: Secondary | ICD-10-CM | POA: Diagnosis not present

## 2017-07-13 DIAGNOSIS — F411 Generalized anxiety disorder: Secondary | ICD-10-CM | POA: Diagnosis not present

## 2017-07-20 DIAGNOSIS — F411 Generalized anxiety disorder: Secondary | ICD-10-CM | POA: Diagnosis not present

## 2017-07-20 DIAGNOSIS — F410 Panic disorder [episodic paroxysmal anxiety] without agoraphobia: Secondary | ICD-10-CM | POA: Diagnosis not present

## 2017-09-04 DIAGNOSIS — M940 Chondrocostal junction syndrome [Tietze]: Secondary | ICD-10-CM | POA: Diagnosis not present

## 2017-09-04 DIAGNOSIS — R0789 Other chest pain: Secondary | ICD-10-CM | POA: Diagnosis not present

## 2017-09-04 DIAGNOSIS — J0110 Acute frontal sinusitis, unspecified: Secondary | ICD-10-CM | POA: Diagnosis not present

## 2017-10-27 DIAGNOSIS — N912 Amenorrhea, unspecified: Secondary | ICD-10-CM | POA: Diagnosis not present

## 2017-10-27 DIAGNOSIS — Z3202 Encounter for pregnancy test, result negative: Secondary | ICD-10-CM | POA: Diagnosis not present

## 2017-10-27 DIAGNOSIS — Z309 Encounter for contraceptive management, unspecified: Secondary | ICD-10-CM | POA: Diagnosis not present

## 2017-10-27 DIAGNOSIS — Z01419 Encounter for gynecological examination (general) (routine) without abnormal findings: Secondary | ICD-10-CM | POA: Diagnosis not present

## 2017-10-27 DIAGNOSIS — Z6824 Body mass index (BMI) 24.0-24.9, adult: Secondary | ICD-10-CM | POA: Diagnosis not present

## 2017-10-27 DIAGNOSIS — Z1389 Encounter for screening for other disorder: Secondary | ICD-10-CM | POA: Diagnosis not present

## 2017-10-27 DIAGNOSIS — Z130 Encounter for screening for diseases of the blood and blood-forming organs and certain disorders involving the immune mechanism: Secondary | ICD-10-CM | POA: Diagnosis not present

## 2017-10-31 ENCOUNTER — Telehealth: Payer: Self-pay | Admitting: Physical Therapy

## 2017-10-31 NOTE — Telephone Encounter (Signed)
Called today and message left that I would not be able to be here 11/03/17 for FCE and option will be on 12/14/16 at 9:30 AM if needs to be here prior to 11/14/17. I also called her x 2 on 12 17/19 but did not record calls.

## 2017-11-02 ENCOUNTER — Telehealth: Payer: Self-pay | Admitting: Physical Therapy

## 2017-11-02 NOTE — Telephone Encounter (Signed)
Called again today and left message that I would not be able to be here 11/03/17 for the FCE scheduled . I offered her 12/31 at 9:30 as an option but may be able to do it later that day.

## 2017-11-03 ENCOUNTER — Ambulatory Visit: Payer: BLUE CROSS/BLUE SHIELD

## 2018-01-04 ENCOUNTER — Encounter (HOSPITAL_COMMUNITY): Payer: Self-pay | Admitting: Emergency Medicine

## 2018-01-04 ENCOUNTER — Emergency Department (HOSPITAL_COMMUNITY): Payer: BLUE CROSS/BLUE SHIELD

## 2018-01-04 ENCOUNTER — Other Ambulatory Visit: Payer: Self-pay

## 2018-01-04 DIAGNOSIS — R0789 Other chest pain: Secondary | ICD-10-CM | POA: Diagnosis not present

## 2018-01-04 DIAGNOSIS — R079 Chest pain, unspecified: Secondary | ICD-10-CM | POA: Insufficient documentation

## 2018-01-04 DIAGNOSIS — R110 Nausea: Secondary | ICD-10-CM | POA: Insufficient documentation

## 2018-01-04 DIAGNOSIS — R1013 Epigastric pain: Secondary | ICD-10-CM | POA: Insufficient documentation

## 2018-01-04 DIAGNOSIS — Z5321 Procedure and treatment not carried out due to patient leaving prior to being seen by health care provider: Secondary | ICD-10-CM | POA: Diagnosis not present

## 2018-01-04 LAB — CBC
HEMATOCRIT: 42.8 % (ref 36.0–46.0)
HEMOGLOBIN: 14.3 g/dL (ref 12.0–15.0)
MCH: 31 pg (ref 26.0–34.0)
MCHC: 33.4 g/dL (ref 30.0–36.0)
MCV: 92.8 fL (ref 78.0–100.0)
Platelets: 199 10*3/uL (ref 150–400)
RBC: 4.61 MIL/uL (ref 3.87–5.11)
RDW: 12.6 % (ref 11.5–15.5)
WBC: 3.9 10*3/uL — ABNORMAL LOW (ref 4.0–10.5)

## 2018-01-04 LAB — I-STAT TROPONIN, ED: Troponin i, poc: 0 ng/mL (ref 0.00–0.08)

## 2018-01-04 LAB — I-STAT BETA HCG BLOOD, ED (MC, WL, AP ONLY): I-stat hCG, quantitative: <5 m[IU]/mL (ref <5)

## 2018-01-04 NOTE — ED Triage Notes (Signed)
Patient with chest pain.  No shortness of breath, initially she had nausea.  She has been worked up for this before and has not had any diagnosis.  She wore a monitor for the month of Oct 2018 and it did not show any cardiac anomaly.  It is in the epigastric area, moves to her right shoulder.

## 2018-01-05 ENCOUNTER — Emergency Department (HOSPITAL_COMMUNITY)
Admission: EM | Admit: 2018-01-05 | Discharge: 2018-01-05 | Discharge status: Against Medical Advice | Payer: BLUE CROSS/BLUE SHIELD | Attending: Emergency Medicine | Admitting: Emergency Medicine

## 2018-01-05 LAB — BASIC METABOLIC PANEL
ANION GAP: 10 (ref 5–15)
BUN: 11 mg/dL (ref 6–20)
CO2: 23 mmol/L (ref 22–32)
Calcium: 9.8 mg/dL (ref 8.9–10.3)
Chloride: 105 mmol/L (ref 101–111)
Creatinine, Ser: 0.86 mg/dL (ref 0.44–1.00)
Glucose, Bld: 91 mg/dL (ref 65–99)
POTASSIUM: 3.5 mmol/L (ref 3.5–5.1)
SODIUM: 138 mmol/L (ref 135–145)

## 2018-01-05 NOTE — ED Notes (Signed)
Pt states she is leaving  

## 2018-01-05 NOTE — ED Notes (Signed)
01/05/2018, follow-up call completed 

## 2018-07-02 DIAGNOSIS — Z3046 Encounter for surveillance of implantable subdermal contraceptive: Secondary | ICD-10-CM | POA: Diagnosis not present

## 2018-07-07 IMAGING — CR DG CHEST 2V
2 series · 2 of 2 positions shown · non-contrast
Comparison: Chest radiograph April 17, 2017

CLINICAL DATA: Syncope, chest pain.

EXAM:
CHEST  2 VIEW

[chest lat]
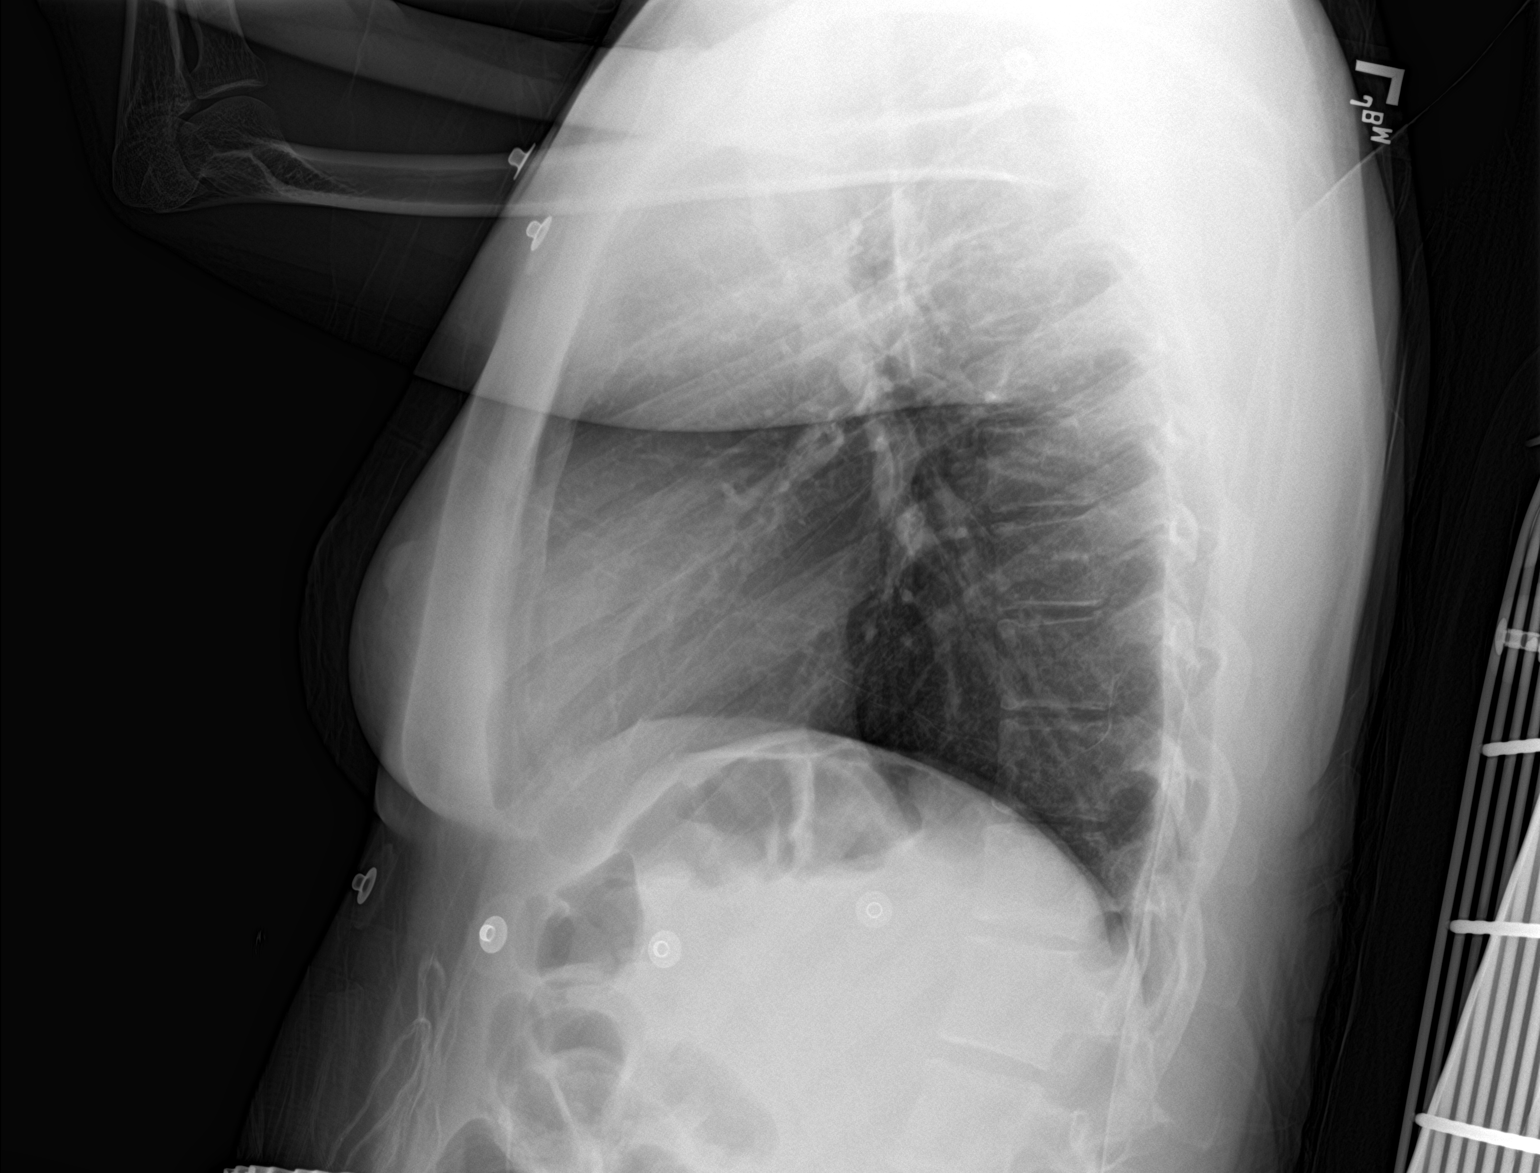

[chest ap]
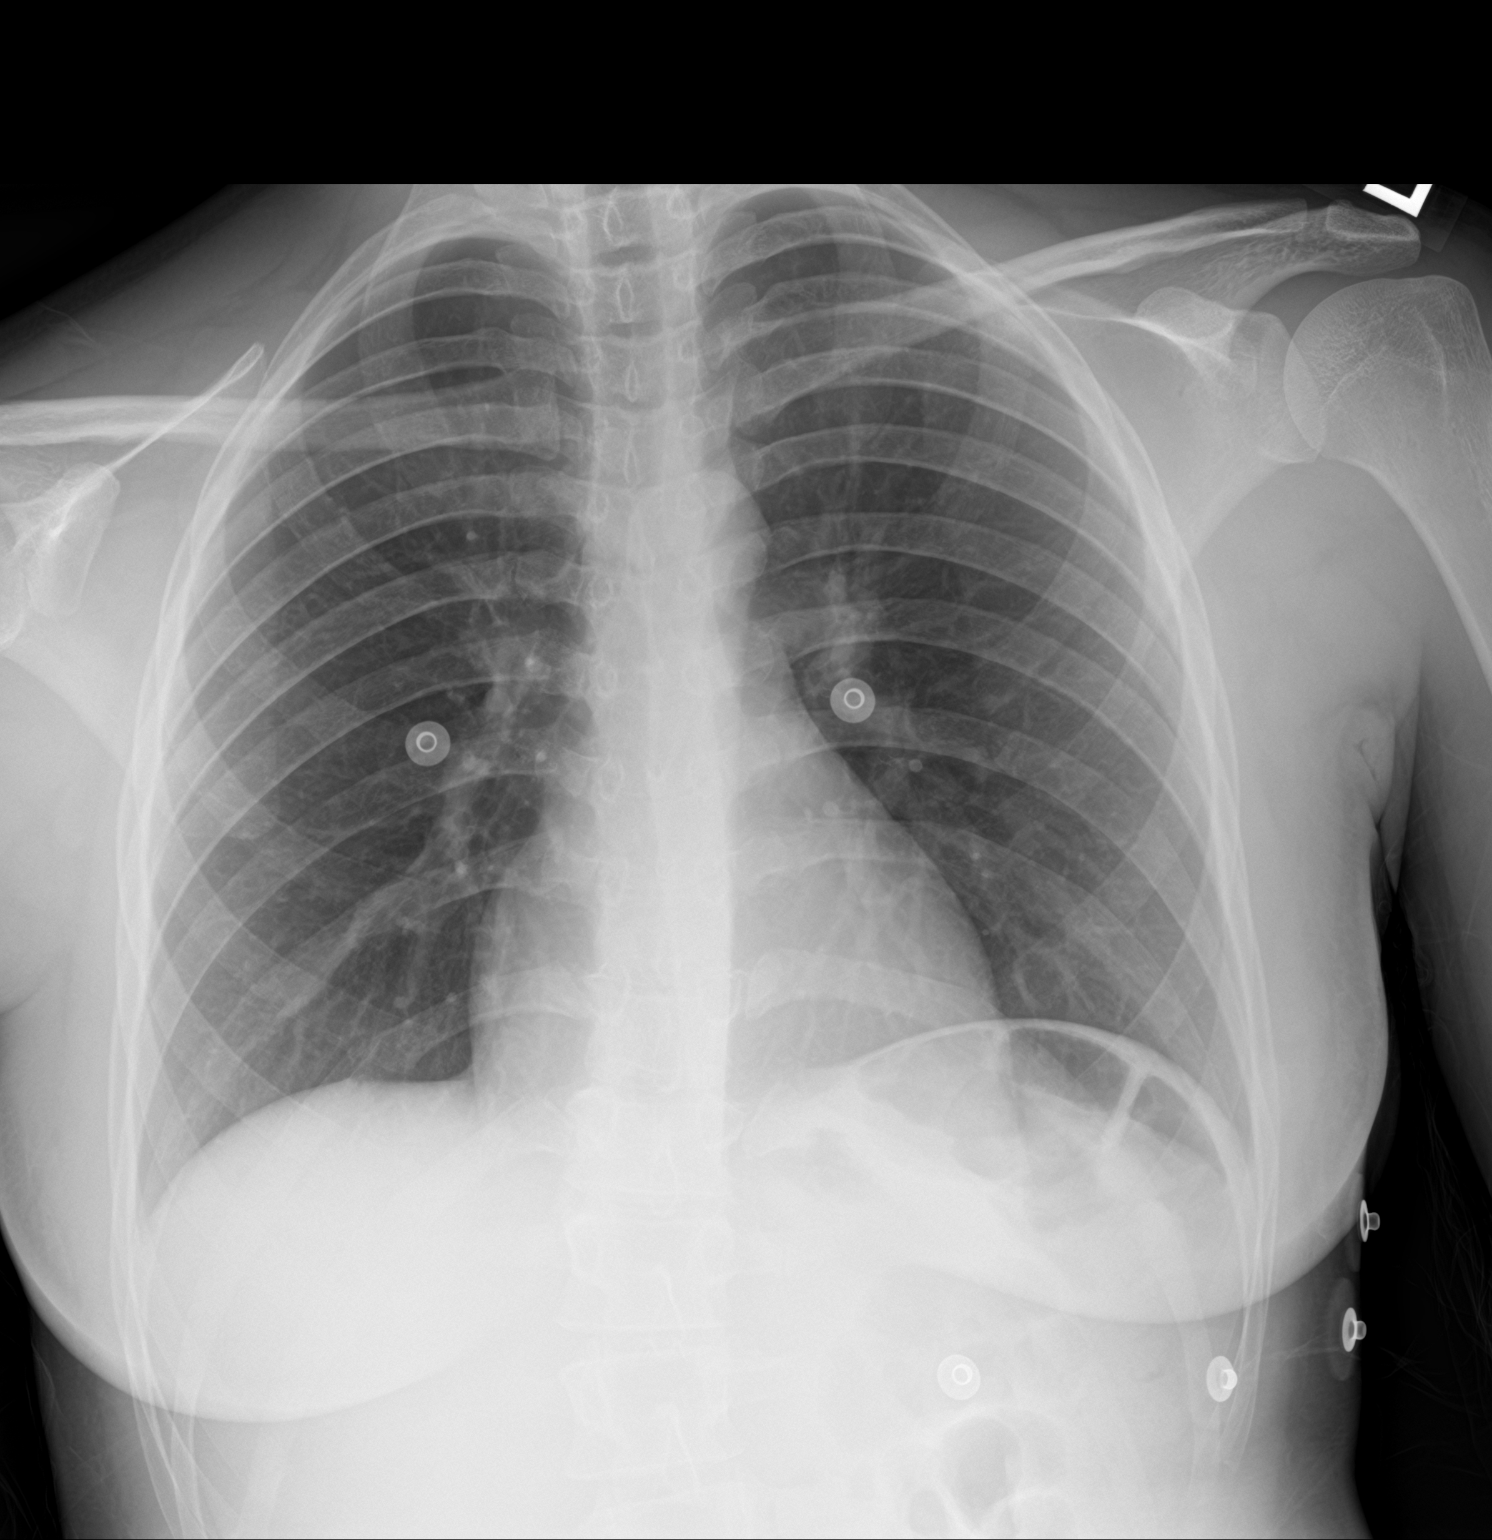

[2 of 2 positions shown; findings below may reference images not displayed]

FINDINGS: Cardiomediastinal silhouette is normal. No pleural effusions or
focal consolidations. Trachea projects midline and there is no
pneumothorax. Soft tissue planes and included osseous structures are
non-suspicious. Mild thoracolumbar dextroscoliosis.
IMPRESSION: Negative.

## 2018-10-04 DIAGNOSIS — N921 Excessive and frequent menstruation with irregular cycle: Secondary | ICD-10-CM | POA: Diagnosis not present

## 2018-10-29 DIAGNOSIS — Z130 Encounter for screening for diseases of the blood and blood-forming organs and certain disorders involving the immune mechanism: Secondary | ICD-10-CM | POA: Diagnosis not present

## 2018-10-29 DIAGNOSIS — Z124 Encounter for screening for malignant neoplasm of cervix: Secondary | ICD-10-CM | POA: Diagnosis not present

## 2018-10-29 DIAGNOSIS — Z3046 Encounter for surveillance of implantable subdermal contraceptive: Secondary | ICD-10-CM | POA: Diagnosis not present

## 2018-10-29 DIAGNOSIS — Z113 Encounter for screening for infections with a predominantly sexual mode of transmission: Secondary | ICD-10-CM | POA: Diagnosis not present

## 2018-10-29 DIAGNOSIS — Z01419 Encounter for gynecological examination (general) (routine) without abnormal findings: Secondary | ICD-10-CM | POA: Diagnosis not present

## 2018-10-29 DIAGNOSIS — Z1151 Encounter for screening for human papillomavirus (HPV): Secondary | ICD-10-CM | POA: Diagnosis not present

## 2018-10-29 DIAGNOSIS — R87612 Low grade squamous intraepithelial lesion on cytologic smear of cervix (LGSIL): Secondary | ICD-10-CM | POA: Diagnosis not present
